# Patient Record
Sex: Male | Born: 1965 | Race: White | Hispanic: No | Marital: Married | Smoking: Never smoker
Health system: Southern US, Community
[De-identification: ages and names within clinical notes are randomized; demographics above are authoritative.]

## PROBLEM LIST (undated history)

## (undated) DIAGNOSIS — E119 Type 2 diabetes mellitus without complications: Secondary | ICD-10-CM

## (undated) DIAGNOSIS — I10 Essential (primary) hypertension: Secondary | ICD-10-CM

## (undated) DIAGNOSIS — Z973 Presence of spectacles and contact lenses: Secondary | ICD-10-CM

## (undated) HISTORY — PX: NO PAST SURGERIES: SHX2092

---

## 2004-06-23 ENCOUNTER — Ambulatory Visit: Payer: Self-pay | Admitting: Family Medicine

## 2005-03-01 ENCOUNTER — Ambulatory Visit: Payer: Self-pay | Admitting: Internal Medicine

## 2005-07-13 ENCOUNTER — Ambulatory Visit: Payer: Self-pay | Admitting: Family Medicine

## 2005-12-10 ENCOUNTER — Ambulatory Visit: Payer: Self-pay | Admitting: Family Medicine

## 2020-10-28 ENCOUNTER — Encounter (HOSPITAL_COMMUNITY): Payer: Self-pay

## 2020-10-28 ENCOUNTER — Other Ambulatory Visit: Payer: Self-pay

## 2020-10-28 ENCOUNTER — Emergency Department (HOSPITAL_COMMUNITY)
Admission: EM | Admit: 2020-10-28 | Discharge: 2020-10-28 | Disposition: A | Discharge status: Home / Self Care | Payer: Self-pay | Attending: Emergency Medicine | Admitting: Emergency Medicine

## 2020-10-28 ENCOUNTER — Emergency Department (HOSPITAL_COMMUNITY): Payer: Self-pay

## 2020-10-28 DIAGNOSIS — I10 Essential (primary) hypertension: Secondary | ICD-10-CM | POA: Insufficient documentation

## 2020-10-28 DIAGNOSIS — E119 Type 2 diabetes mellitus without complications: Secondary | ICD-10-CM | POA: Insufficient documentation

## 2020-10-28 DIAGNOSIS — N200 Calculus of kidney: Secondary | ICD-10-CM | POA: Insufficient documentation

## 2020-10-28 HISTORY — DX: Essential (primary) hypertension: I10

## 2020-10-28 HISTORY — DX: Type 2 diabetes mellitus without complications: E11.9

## 2020-10-28 LAB — COMPREHENSIVE METABOLIC PANEL
ALT: 38 U/L (ref 0–44)
AST: 22 U/L (ref 15–41)
Albumin: 3.7 g/dL (ref 3.5–5.0)
Alkaline Phosphatase: 50 U/L (ref 38–126)
Anion gap: 9 (ref 5–15)
BUN: 22 mg/dL — ABNORMAL HIGH (ref 6–20)
CO2: 23 mmol/L (ref 22–32)
Calcium: 8.9 mg/dL (ref 8.9–10.3)
Chloride: 103 mmol/L (ref 98–111)
Creatinine, Ser: 1.03 mg/dL (ref 0.61–1.24)
GFR, Estimated: >60 mL/min (ref >60)
Glucose, Bld: 156 mg/dL — ABNORMAL HIGH (ref 70–99)
Potassium: 3.4 mmol/L — ABNORMAL LOW (ref 3.5–5.1)
Sodium: 135 mmol/L (ref 135–145)
Total Bilirubin: 0.5 mg/dL (ref 0.3–1.2)
Total Protein: 6.8 g/dL (ref 6.5–8.1)

## 2020-10-28 LAB — CBC WITH DIFFERENTIAL/PLATELET
Abs Immature Granulocytes: 0.03 10*3/uL (ref 0.00–0.07)
Basophils Absolute: 0.1 10*3/uL (ref 0.0–0.1)
Basophils Relative: 1 %
Eosinophils Absolute: 0.4 10*3/uL (ref 0.0–0.5)
Eosinophils Relative: 5 %
HCT: 40 % (ref 39.0–52.0)
Hemoglobin: 13.5 g/dL (ref 13.0–17.0)
Immature Granulocytes: 0 %
Lymphocytes Relative: 24 %
Lymphs Abs: 2.1 10*3/uL (ref 0.7–4.0)
MCH: 29.4 pg (ref 26.0–34.0)
MCHC: 33.8 g/dL (ref 30.0–36.0)
MCV: 87.1 fL (ref 80.0–100.0)
Monocytes Absolute: 0.5 10*3/uL (ref 0.1–1.0)
Monocytes Relative: 6 %
Neutro Abs: 5.7 10*3/uL (ref 1.7–7.7)
Neutrophils Relative %: 64 %
Platelets: 312 10*3/uL (ref 150–400)
RBC: 4.59 MIL/uL (ref 4.22–5.81)
RDW: 12.7 % (ref 11.5–15.5)
WBC: 8.8 10*3/uL (ref 4.0–10.5)
nRBC: 0 % (ref 0.0–0.2)

## 2020-10-28 MED ORDER — FENTANYL CITRATE (PF) 100 MCG/2ML IJ SOLN
50.0000 ug | Freq: Once | INTRAMUSCULAR | Status: AC
  Administered 2020-10-28: 50 ug via INTRAVENOUS
  Filled 2020-10-28: qty 2

## 2020-10-28 MED ORDER — HYDROCODONE-ACETAMINOPHEN 5-325 MG PO TABS: 1.0000 | ORAL_TABLET | Freq: Four times a day (QID) | ORAL | 0 refills | Status: DC | PRN

## 2020-10-28 MED ORDER — ONDANSETRON 4 MG PO TBDP: 4.0000 mg | ORAL_TABLET | Freq: Three times a day (TID) | ORAL | 0 refills | Status: AC | PRN

## 2020-10-28 MED ORDER — SODIUM CHLORIDE 0.9 % IV BOLUS
1000.0000 mL | Freq: Once | INTRAVENOUS | Status: AC
  Administered 2020-10-28: 1000 mL via INTRAVENOUS

## 2020-10-28 MED ORDER — KETOROLAC TROMETHAMINE 30 MG/ML IJ SOLN
15.0000 mg | Freq: Once | INTRAMUSCULAR | Status: AC
  Administered 2020-10-28: 15 mg via INTRAVENOUS
  Filled 2020-10-28: qty 1

## 2020-10-28 NOTE — ED Provider Notes (Signed)
[facility] EMERGENCY DEPARTMENT Provider Note   CSN: [id number] Arrival date & time: 10/28/20  1900     History Chief Complaint  Patient presents with  . Flank Pain    Joseph Mcmahon is a 55 y.o. male.  HPI Patient without a history of kidney stones presents with concern for kidney stone identified at primary care office earlier in the day.  Patient has had pain in the right lower back since yesterday, about 24 hours ago.  Initially patient had Percocet, now ibuprofen for the day, but has had persistent severe pain focally in this area.  There is new hematuria, polyuria, but no fever, no vomiting. Patient presents with paper report and CD of his x-ray from primary care.  Paper report includes labs, notable for urinalysis with hematuria, and x-ray report with 1.2 cm stone, right.     Past Medical History:  Diagnosis Date  . Diabetes mellitus without complication (HCC)   . Hypertension     There are no problems to display for this patient.   History reviewed. No pertinent surgical history.     Family History  Problem Relation Age of Onset  . Diabetes Sister   . Stroke Brother     Social History   Tobacco Use  . Smoking status: Never Smoker  . Smokeless tobacco: Never Used    Home Medications Prior to Admission medications   Not on File    Allergies    Patient has no known allergies.  Review of Systems   Review of Systems  Constitutional:       Per HPI, otherwise negative  HENT:       Per HPI, otherwise negative  Respiratory:       Per HPI, otherwise negative  Cardiovascular:       Per HPI, otherwise negative  Gastrointestinal: Negative for vomiting.  Endocrine:       Negative aside from HPI  Genitourinary:       Neg aside from HPI   Musculoskeletal:       Per HPI, otherwise negative  Skin: Negative.   Neurological: Negative for syncope.    Physical Exam Updated Vital Signs BP 125/88   Pulse 73   Temp 98.5 F (36.9 C) (Oral)   Resp 20    Ht 5\' 8"  (1.727 m)   Wt 99.8 kg   SpO2 94%   BMI 33.45 kg/m   Physical Exam Vitals and nursing note reviewed.  Constitutional:      General: He is not in acute distress.    Appearance: He is well-developed.  HENT:     Head: Normocephalic and atraumatic.  Eyes:     Conjunctiva/sclera: Conjunctivae normal.  Cardiovascular:     Rate and Rhythm: Normal rate and regular rhythm.  Pulmonary:     Effort: Pulmonary effort is normal. No respiratory distress.     Breath sounds: No stridor.  Abdominal:     General: There is no distension.     Comments: Right flank pain  Skin:    General: Skin is warm and dry.  Neurological:     Mental Status: He is alert and oriented to person, place, and time.     ED Results / Procedures / Treatments   Labs (all labs ordered are listed, but only abnormal results are displayed) Labs Reviewed  COMPREHENSIVE METABOLIC PANEL - Abnormal; Notable for the following components:      Result Value   Potassium 3.4 (*)    Glucose, Bld 156 (*)  BUN 22 (*)    All other components within normal limits  CBC WITH DIFFERENTIAL/PLATELET  URINALYSIS, ROUTINE W REFLEX MICROSCOPIC    EKG None  Radiology CT Renal Stone Study  Result Date: 10/28/2020 CLINICAL DATA:  Right flank pain EXAM: CT ABDOMEN AND PELVIS WITHOUT CONTRAST TECHNIQUE: Multidetector CT imaging of the abdomen and pelvis was performed following the standard protocol without IV contrast. COMPARISON:  None. FINDINGS: Lower chest: The visualized lung bases are clear. The visualized heart and pericardium are unremarkable. Tiny hiatal hernia. Hepatobiliary: Wedge like area of low attenuation within the right hepatic lobe is most in keeping with geographic fatty hepatic infiltration. The liver is otherwise unremarkable. No intra or extrahepatic biliary ductal dilation. Gallbladder unremarkable. Pancreas: Unremarkable Spleen: Unremarkable Adrenals/Urinary Tract: The adrenal glands are unremarkable. The  kidneys are normal in size and position. 8.5 cm simple cortical cyst arises exophytically from the upper pole of the right kidney. A nonobstructing right pelvic calculus is identified measuring 8 x 12 x 15 mm. There is no hydronephrosis. No additional renal or ureteral calculi are identified. The bladder is unremarkable. Stomach/Bowel: Stomach is within normal limits. Appendix appears normal. No evidence of bowel wall thickening, distention, or inflammatory changes. No free intraperitoneal gas or fluid. Vascular/Lymphatic: Mild aortoiliac atherosclerotic calcification. No aortic aneurysm. No pathologic adenopathy within the abdomen and pelvis. Reproductive: Prostate is unremarkable. Other: Small fat containing umbilical hernia. The rectum is unremarkable. Musculoskeletal: Degenerative changes are seen at the lumbosacral junction. No acute bone abnormality. IMPRESSION: 15 mm nonobstructing calculus within the right renal pelvis. 8.5 cm simple cyst within the upper pole of the right kidney. Wedge like region of a low-attenuation within the right hepatic lobe most likely representing an area of geographic fatty hepatic infiltration. This is not definitively characterized on this examination, however, and could be confirmed with MRI examination, if desired. Aortic Atherosclerosis (ICD10-I70.0). Electronically Signed   By: Helyn Numbers MD   On: 10/28/2020 22:08    Procedures Procedures   Medications Ordered in ED Medications  sodium chloride 0.9 % bolus 1,000 mL (0 mLs Intravenous Stopped 10/28/20 2259)  ketorolac (TORADOL) 30 MG/ML injection 15 mg (15 mg Intravenous Given 10/28/20 2200)  fentaNYL (SUBLIMAZE) injection 50 mcg (50 mcg Intravenous Given 10/28/20 2158)    ED Course  I have reviewed the triage vital signs and the nursing notes.  Pertinent labs & imaging results that were available during my care of the patient were reviewed by me and considered in my medical decision making (see chart for  details).    11:06 PM On repeat exam patient is awake, alert, speaking clearly, notes that he feels better.  He did have transient dizziness after the analgesia, but this has resolved. He has no evidence for hemodynamic stability. Labs here reassuring, urinalysis from outside hospital reviewed, no evidence for infection. I demonstrated the patient's CT images to him at bedside, specifically illustrating the kidney stone, and cyst.  No evidence for complete obstruction, no evidence for infection, this patient is appropriate for close outpatient urology follow-up. MDM Rules/Calculators/A&P MDM Number of Diagnoses or Management Options Nephrolithiasis: new, needed workup   Amount and/or Complexity of Data Reviewed Clinical lab tests: ordered and reviewed Tests in the radiology section of CPT: ordered and reviewed Tests in the medicine section of CPT: reviewed and ordered Decide to obtain previous medical records or to obtain history from someone other than the patient: yes Review and summarize past medical records: yes Independent visualization of images, tracings, or  specimens: yes  Risk of Complications, Morbidity, and/or Mortality Presenting problems: high Diagnostic procedures: high Management options: high  Critical Care Total time providing critical care: < 30 minutes  Patient Progress Patient progress: improved  Final Clinical Impression(s) / ED Diagnoses Final diagnoses:  Nephrolithiasis    Rx / DC Orders ED Discharge Orders         Ordered    HYDROcodone-acetaminophen (NORCO/VICODIN) 5-325 MG tablet  Every 6 hours PRN        10/28/20 2309    ondansetron (ZOFRAN ODT) 4 MG disintegrating tablet  Every 8 hours PRN        10/28/20 2309           Gerhard Munch, MD 10/28/20 2310

## 2020-10-28 NOTE — Discharge Instructions (Signed)
As discussed, you have been diagnosed with a kidney stone.  Please be sure to follow-up with our urology colleagues.  Please call tomorrow, and inquire whether there are openings available in their [place] or [place] offices.  In the interim, stay well-hydrated, and monitor your condition carefully.  I do not hesitate to return here for concerning changes.

## 2020-10-28 NOTE — ED Triage Notes (Signed)
Pt was sent by PCP due to having a large kidney stone. Pt. Is currently experiencing right flank pain of 7.

## 2020-10-28 NOTE — ED Notes (Signed)
PrePack of Zofran 6 tablet and Norco 6 tablet given and signed for by pt.

## 2020-10-29 MED FILL — Hydrocodone-Acetaminophen Tab 5-325 MG: ORAL | Qty: 6 | Status: AC

## 2020-10-31 DIAGNOSIS — R319 Hematuria, unspecified: Secondary | ICD-10-CM

## 2020-10-31 HISTORY — DX: Hematuria, unspecified: R31.9

## 2020-11-03 ENCOUNTER — Encounter (HOSPITAL_BASED_OUTPATIENT_CLINIC_OR_DEPARTMENT_OTHER): Payer: Self-pay | Admitting: Urology

## 2020-11-03 ENCOUNTER — Other Ambulatory Visit: Payer: Self-pay | Admitting: Urology

## 2020-11-03 ENCOUNTER — Other Ambulatory Visit: Payer: Self-pay

## 2020-11-03 DIAGNOSIS — R519 Headache, unspecified: Secondary | ICD-10-CM

## 2020-11-03 DIAGNOSIS — E119 Type 2 diabetes mellitus without complications: Secondary | ICD-10-CM

## 2020-11-03 DIAGNOSIS — N200 Calculus of kidney: Secondary | ICD-10-CM

## 2020-11-03 DIAGNOSIS — E785 Hyperlipidemia, unspecified: Secondary | ICD-10-CM

## 2020-11-03 DIAGNOSIS — G589 Mononeuropathy, unspecified: Secondary | ICD-10-CM

## 2020-11-03 HISTORY — DX: Type 2 diabetes mellitus without complications: E11.9

## 2020-11-03 HISTORY — DX: Headache, unspecified: R51.9

## 2020-11-03 HISTORY — DX: Hyperlipidemia, unspecified: E78.5

## 2020-11-03 HISTORY — DX: Mononeuropathy, unspecified: G58.9

## 2020-11-03 HISTORY — DX: Calculus of kidney: N20.0

## 2020-11-03 NOTE — Progress Notes (Addendum)
Spoke w/ via phone for pre-op interview---pt Lab needs dos---- ekg  per anesth, surgery orders need 2nd sign            Lab results------cbc with dif, cmp 10-28-2020 epic COVID test -----patient states asymptomatic no test needed Arrive at -------630 am 11-05-2020 NPO after MN NO Solid Food.  Clear liquids from MN until---530 am then npo Med rec completed Medications to take morning of surgery -----zofran prn, atorvastatin Diabetic medication -----none day of surgery Patient instructed no nail polish to be worn day of surgery n/a Patient instructed to bring photo id and insurance card day of surgery Patient aware to have Driver (ride ) / caregiver  girlfriend carly will stay  for 24 hours after surgery  Patient Special Instructions -----none Pre-Op special Istructions -----none Patient verbalized understanding of instructions that were given at this phone interview. Patient denies shortness of breath, chest pain, fever, cough at this phone interview.

## 2020-11-04 NOTE — Anesthesia Preprocedure Evaluation (Addendum)
Anesthesia Evaluation  Patient identified by MRN, date of birth, ID band Patient awake    Reviewed: Allergy & Precautions, H&P , NPO status , Patient's Chart, lab work & pertinent test results  Airway Mallampati: III  TM Distance: >3 FB Neck ROM: Full    Dental no notable dental hx. (+) Teeth Intact, Dental Advisory Given   Pulmonary neg pulmonary ROS,    Pulmonary exam normal breath sounds clear to auscultation       Cardiovascular Exercise Tolerance: Good hypertension, Pt. on medications  Rhythm:Regular Rate:Normal     Neuro/Psych  Headaches, negative psych ROS   GI/Hepatic negative GI ROS, Neg liver ROS,   Endo/Other  diabetes, Type 2, Oral Hypoglycemic Agents  Renal/GU Renal disease  negative genitourinary   Musculoskeletal   Abdominal   Peds  Hematology negative hematology ROS (+)   Anesthesia Other Findings   Reproductive/Obstetrics negative OB ROS                            Anesthesia Physical Anesthesia Plan  ASA: 2  Anesthesia Plan: General   Post-op Pain Management:    Induction: Intravenous  PONV Risk Score and Plan: 3 and Ondansetron, Dexamethasone and Midazolam  Airway Management Planned: LMA  Additional Equipment:   Intra-op Plan:   Post-operative Plan: Extubation in OR  Informed Consent: I have reviewed the patients History and Physical, chart, labs and discussed the procedure including the risks, benefits and alternatives for the proposed anesthesia with the patient or authorized representative who has indicated his/her understanding and acceptance.     Dental advisory given  Plan Discussed with: CRNA  Anesthesia Plan Comments:        Anesthesia Quick Evaluation

## 2020-11-05 ENCOUNTER — Encounter (HOSPITAL_BASED_OUTPATIENT_CLINIC_OR_DEPARTMENT_OTHER): Payer: Self-pay | Admitting: Urology

## 2020-11-05 ENCOUNTER — Encounter (HOSPITAL_BASED_OUTPATIENT_CLINIC_OR_DEPARTMENT_OTHER)
Admission: RE | Disposition: A | Discharge status: Home / Self Care | Payer: Self-pay | Source: Home / Self Care | Attending: Urology

## 2020-11-05 ENCOUNTER — Ambulatory Visit (HOSPITAL_BASED_OUTPATIENT_CLINIC_OR_DEPARTMENT_OTHER)
Admission: RE | Admit: 2020-11-05 | Discharge: 2020-11-05 | Disposition: A | Discharge status: Home / Self Care | Payer: Self-pay | Attending: Urology | Admitting: Urology

## 2020-11-05 ENCOUNTER — Ambulatory Visit (HOSPITAL_BASED_OUTPATIENT_CLINIC_OR_DEPARTMENT_OTHER): Payer: Self-pay | Admitting: Anesthesiology

## 2020-11-05 DIAGNOSIS — Z833 Family history of diabetes mellitus: Secondary | ICD-10-CM | POA: Insufficient documentation

## 2020-11-05 DIAGNOSIS — N35911 Unspecified urethral stricture, male, meatal: Secondary | ICD-10-CM | POA: Insufficient documentation

## 2020-11-05 DIAGNOSIS — Z7984 Long term (current) use of oral hypoglycemic drugs: Secondary | ICD-10-CM | POA: Insufficient documentation

## 2020-11-05 DIAGNOSIS — N200 Calculus of kidney: Secondary | ICD-10-CM | POA: Insufficient documentation

## 2020-11-05 DIAGNOSIS — Z79899 Other long term (current) drug therapy: Secondary | ICD-10-CM | POA: Insufficient documentation

## 2020-11-05 HISTORY — DX: Presence of spectacles and contact lenses: Z97.3

## 2020-11-05 HISTORY — PX: CYSTOSCOPY WITH RETROGRADE PYELOGRAM, URETEROSCOPY AND STENT PLACEMENT: SHX5789

## 2020-11-05 LAB — GLUCOSE, CAPILLARY
Glucose-Capillary: 192 mg/dL — ABNORMAL HIGH (ref 70–99)
Glucose-Capillary: 191 mg/dL — ABNORMAL HIGH (ref 70–99)

## 2020-11-05 SURGERY — CYSTOURETEROSCOPY, WITH RETROGRADE PYELOGRAM AND STENT INSERTION
Anesthesia: General | Site: Ureter | Laterality: Right

## 2020-11-05 MED ORDER — DEXAMETHASONE SODIUM PHOSPHATE 10 MG/ML IJ SOLN
INTRAMUSCULAR | Status: AC
  Filled 2020-11-05: qty 1

## 2020-11-05 MED ORDER — FENTANYL CITRATE (PF) 100 MCG/2ML IJ SOLN: 25.0000 ug | INTRAMUSCULAR | Status: DC | PRN

## 2020-11-05 MED ORDER — ONDANSETRON HCL 4 MG/2ML IJ SOLN
INTRAMUSCULAR | Status: DC | PRN
  Administered 2020-11-05: 4 mg via INTRAVENOUS

## 2020-11-05 MED ORDER — OXYBUTYNIN CHLORIDE 5 MG PO TABS: 5.0000 mg | ORAL_TABLET | Freq: Three times a day (TID) | ORAL | 1 refills | Status: AC | PRN

## 2020-11-05 MED ORDER — MIDAZOLAM HCL 2 MG/2ML IJ SOLN
INTRAMUSCULAR | Status: AC
  Filled 2020-11-05: qty 2

## 2020-11-05 MED ORDER — HYDROCODONE-ACETAMINOPHEN 5-325 MG PO TABS: 1.0000 | ORAL_TABLET | ORAL | 0 refills | Status: AC | PRN

## 2020-11-05 MED ORDER — FENTANYL CITRATE (PF) 100 MCG/2ML IJ SOLN
INTRAMUSCULAR | Status: AC
  Filled 2020-11-05: qty 2

## 2020-11-05 MED ORDER — LIDOCAINE 2% (20 MG/ML) 5 ML SYRINGE
INTRAMUSCULAR | Status: DC | PRN
  Administered 2020-11-05: 60 mg via INTRAVENOUS
  Administered 2020-11-05: 40 mg via INTRAVENOUS

## 2020-11-05 MED ORDER — LACTATED RINGERS IV SOLN: INTRAVENOUS | Status: DC

## 2020-11-05 MED ORDER — ONDANSETRON HCL 4 MG/2ML IJ SOLN
INTRAMUSCULAR | Status: AC
  Filled 2020-11-05: qty 2

## 2020-11-05 MED ORDER — PROPOFOL 10 MG/ML IV BOLUS
INTRAVENOUS | Status: AC
  Filled 2020-11-05: qty 40

## 2020-11-05 MED ORDER — SODIUM CHLORIDE 0.9 % IR SOLN
Status: DC | PRN
  Administered 2020-11-05: 3000 mL via INTRAVESICAL

## 2020-11-05 MED ORDER — PHENYLEPHRINE 40 MCG/ML (10ML) SYRINGE FOR IV PUSH (FOR BLOOD PRESSURE SUPPORT)
PREFILLED_SYRINGE | INTRAVENOUS | Status: AC
  Filled 2020-11-05: qty 10

## 2020-11-05 MED ORDER — PHENYLEPHRINE 40 MCG/ML (10ML) SYRINGE FOR IV PUSH (FOR BLOOD PRESSURE SUPPORT)
PREFILLED_SYRINGE | INTRAVENOUS | Status: DC | PRN
  Administered 2020-11-05: 120 ug via INTRAVENOUS
  Administered 2020-11-05 (×2): 80 ug via INTRAVENOUS
  Administered 2020-11-05: 120 ug via INTRAVENOUS

## 2020-11-05 MED ORDER — MIDAZOLAM HCL 2 MG/2ML IJ SOLN
INTRAMUSCULAR | Status: DC | PRN
  Administered 2020-11-05: 2 mg via INTRAVENOUS

## 2020-11-05 MED ORDER — PROPOFOL 10 MG/ML IV BOLUS
INTRAVENOUS | Status: DC | PRN
  Administered 2020-11-05: 200 mg via INTRAVENOUS

## 2020-11-05 MED ORDER — ACETAMINOPHEN 500 MG PO TABS
1000.0000 mg | ORAL_TABLET | Freq: Once | ORAL | Status: AC
  Administered 2020-11-05: 1000 mg via ORAL

## 2020-11-05 MED ORDER — CEFAZOLIN SODIUM-DEXTROSE 2-4 GM/100ML-% IV SOLN
2.0000 g | INTRAVENOUS | Status: AC
  Administered 2020-11-05: 2 g via INTRAVENOUS

## 2020-11-05 MED ORDER — KETOROLAC TROMETHAMINE 30 MG/ML IJ SOLN
INTRAMUSCULAR | Status: DC | PRN
  Administered 2020-11-05: 30 mg via INTRAVENOUS

## 2020-11-05 MED ORDER — KETOROLAC TROMETHAMINE 30 MG/ML IJ SOLN
INTRAMUSCULAR | Status: AC
  Filled 2020-11-05: qty 1

## 2020-11-05 MED ORDER — CEPHALEXIN 500 MG PO CAPS: 500.0000 mg | ORAL_CAPSULE | Freq: Two times a day (BID) | ORAL | 0 refills | Status: AC

## 2020-11-05 MED ORDER — CEFAZOLIN SODIUM-DEXTROSE 2-4 GM/100ML-% IV SOLN
INTRAVENOUS | Status: AC
  Filled 2020-11-05: qty 100

## 2020-11-05 MED ORDER — DEXAMETHASONE SODIUM PHOSPHATE 10 MG/ML IJ SOLN
INTRAMUSCULAR | Status: DC | PRN
  Administered 2020-11-05: 5 mg via INTRAVENOUS

## 2020-11-05 MED ORDER — FENTANYL CITRATE (PF) 100 MCG/2ML IJ SOLN
INTRAMUSCULAR | Status: DC | PRN
  Administered 2020-11-05 (×3): 50 ug via INTRAVENOUS

## 2020-11-05 MED ORDER — ACETAMINOPHEN 500 MG PO TABS
ORAL_TABLET | ORAL | Status: AC
  Filled 2020-11-05: qty 2

## 2020-11-05 MED ORDER — IOHEXOL 300 MG/ML  SOLN
INTRAMUSCULAR | Status: DC | PRN
  Administered 2020-11-05: 4 mL

## 2020-11-05 MED ORDER — ARTIFICIAL TEARS OPHTHALMIC OINT
TOPICAL_OINTMENT | OPHTHALMIC | Status: AC
  Filled 2020-11-05: qty 3.5

## 2020-11-05 SURGICAL SUPPLY — 28 items
APL SKNCLS STERI-STRIP NONHPOA (GAUZE/BANDAGES/DRESSINGS)
BAG DRAIN URO-CYSTO SKYTR STRL (DRAIN) ×2 IMPLANT
BAG DRN UROCATH (DRAIN) ×1
BASKET STONE 1.7 NGAGE (UROLOGICAL SUPPLIES) IMPLANT
BASKET ZERO TIP NITINOL 2.4FR (BASKET) ×2 IMPLANT
BENZOIN TINCTURE PRP APPL 2/3 (GAUZE/BANDAGES/DRESSINGS) IMPLANT
BSKT STON RTRVL ZERO TP 2.4FR (BASKET) ×1
CATH URET 5FR 28IN OPEN ENDED (CATHETERS) IMPLANT
CLOTH BEACON ORANGE TIMEOUT ST (SAFETY) ×2 IMPLANT
COVER DOME SNAP 22 D (MISCELLANEOUS) ×1 IMPLANT
FIBER LASER FLEXIVA 365 (UROLOGICAL SUPPLIES) IMPLANT
GLOVE SURG ENC MOIS LTX SZ7.5 (GLOVE) ×2 IMPLANT
GOWN STRL REUS W/TWL XL LVL3 (GOWN DISPOSABLE) ×2 IMPLANT
GUIDEWIRE STR DUAL SENSOR (WIRE) ×1 IMPLANT
GUIDEWIRE ZIPWRE .038 STRAIGHT (WIRE) ×2 IMPLANT
IV NS IRRIG 3000ML ARTHROMATIC (IV SOLUTION) ×4 IMPLANT
KIT TURNOVER CYSTO (KITS) ×2 IMPLANT
MANIFOLD NEPTUNE II (INSTRUMENTS) ×2 IMPLANT
NS IRRIG 500ML POUR BTL (IV SOLUTION) ×2 IMPLANT
PACK CYSTO (CUSTOM PROCEDURE TRAY) ×2 IMPLANT
SHEATH URET ACCESS 12FR/35CM (UROLOGICAL SUPPLIES) ×2 IMPLANT
STENT URET 6FRX24 CONTOUR (STENTS) ×2 IMPLANT
STRIP CLOSURE SKIN 1/2X4 (GAUZE/BANDAGES/DRESSINGS) IMPLANT
SYR 10ML LL (SYRINGE) ×2 IMPLANT
TRACTIP FLEXIVA PULS ID 200XHI (Laser) IMPLANT
TRACTIP FLEXIVA PULSE ID 200 (Laser) ×2
TUBE CONNECTING 12X1/4 (SUCTIONS) IMPLANT
TUBING UROLOGY SET (TUBING) ×2 IMPLANT

## 2020-11-05 NOTE — Transfer of Care (Signed)
Immediate Anesthesia Transfer of Care Note  Patient: Joseph Mcmahon  Procedure(s) Performed: Procedure(s) (LRB): CYSTOSCOPY WITH RIGHT RETROGRADE PYELOGRAM, URETEROSCOPY HOLMIUM LASER STONE EXTRACTIONAND STENT PLACEMENT (Right)  Patient Location: PACU  Anesthesia Type: General  Level of Consciousness: awake, alert  and oriented  Airway & Oxygen Therapy: Patient Spontanous Breathing and Patient connected to nasal cannula oxygen  Post-op Assessment: Report given to PACU RN and Post -op Vital signs reviewed and stable  Post vital signs: Reviewed and stable  Complications: No apparent anesthesia complications  Last Vitals:  Vitals Value Taken Time  BP 129/80 11/05/20 0945  Temp 36.4 C 11/05/20 0943  Pulse 81 11/05/20 0947  Resp 10 11/05/20 0947  SpO2 93 % 11/05/20 0947  Vitals shown include unvalidated device data.  Last Pain:  Vitals:   11/05/20 0639  TempSrc: Oral  PainSc: 8       Patients Stated Pain Goal: 4 (11/05/20 2956)  Complications: No notable events documented.

## 2020-11-05 NOTE — Discharge Instructions (Addendum)
Post Anesthesia Home Care Instructions  Activity: Get plenty of rest for the remainder of the day. A responsible individual must stay with you for 24 hours following the procedure.  For the next 24 hours, DO NOT: -Drive a car -Advertising copywriter -Drink alcoholic beverages -Take any medication unless instructed by your physician -Make any legal decisions or sign important papers.  Meals: Start with liquid foods such as gelatin or soup. Progress to regular foods as tolerated. Avoid greasy, spicy, heavy foods. If nausea and/or vomiting occur, drink only clear liquids until the nausea and/or vomiting subsides. Call your physician if vomiting continues.  Special Instructions/Symptoms: Your throat may feel dry or sore from the anesthesia or the breathing tube placed in your throat during surgery. If this causes discomfort, gargle with warm salt water. The discomfort should disappear within 24 hours.  No ibuprofen, Advil, Aleve, Motrin, or naproxen until after 3:30 pm today if needed.   [facility] [phone] Post Ureteroscopy With or Without Stent Instructions  Definitions:  Ureter: The duct that transports urine from the kidney to the bladder. Stent:   A plastic hollow tube that is placed into the ureter, from the kidney to the bladder to prevent the ureter from swelling shut.  GENERAL INSTRUCTIONS:  Despite the fact that no skin incisions were used, the area around the ureter and bladder is raw and irritated. The stent is a foreign body which will further irritate the bladder wall. This irritation is manifested by increased frequency of urination, both day and night, and by an increase in the urge to urinate. In some, the urge to urinate is present almost always. Sometimes the urge is strong enough that you may not be able to stop yourself from urinating. The only real cure is to remove the stent and then give time for the bladder wall to heal which can't be done until the  danger of the ureter swelling shut has passed, which varies.  You may see some blood in your urine while the stent is in place and a few days afterwards. Do not be alarmed, even if the urine was clear for a while. Get off your feet and drink lots of fluids until clearing occurs. If you start to pass clots or don't improve, call us.  DIET: You may return to your normal diet immediately. Because of the raw surface of your bladder, alcohol, spicy foods, acid type foods and drinks with caffeine may cause irritation or frequency and should be used in moderation. To keep your urine flowing freely and to avoid constipation, drink plenty of fluids during the day ( 8-10 glasses ). Tip: Avoid cranberry juice because it is very acidic.  ACTIVITY: Your physical activity doesn't need to be restricted. However, if you are very active, you may see some blood in your urine. We suggest that you reduce your activity under these circumstances until the bleeding has stopped.  BOWELS: It is important to keep your bowels regular during the postoperative period. Straining with bowel movements can cause bleeding. A bowel movement every other day is reasonable. Use a mild laxative if needed, such as Milk of Magnesia 2-3 tablespoons, or 2 Dulcolax tablets. Call if you continue to have problems. If you have been taking narcotics for pain, before, during or after your surgery, you may be constipated. Take a laxative if necessary.   MEDICATION: You should resume your pre-surgery medications unless told not to. In addition you will often be given an antibiotic to prevent infection.  These should be taken as prescribed until the bottles are finished unless you are having an unusual reaction to one of the drugs.  PROBLEMS YOU SHOULD REPORT TO [place]: Fevers over 100.5 Fahrenheit. Heavy bleeding, or clots ( See above notes about blood in urine ). Inability to urinate. Drug reactions ( hives, rash, nausea, vomiting, diarrhea  ). Severe burning or pain with urination that is not improving.  FOLLOW-UP: You will need a follow-up appointment to monitor your progress. Call for this appointment at the number listed above. Usually the first appointment will be about three to fourteen days after your surgery.

## 2020-11-05 NOTE — Op Note (Signed)
Operative Note  Preoperative diagnosis:  1.  1.5 cm right renal stone  Postoperative diagnosis: 1.  1.5 cm right renal stone 2.  Meatal stenosis  Procedure(s): 1.  Cystoscopy with right ureteroscopy, holmium laser lithotripsy and right JJ stent placement 2.  Right retrograde pyelogram with intraoperative interpretation fluoroscopic imaging 3.  Urethral dilation with Leander Rams sounds  Surgeon: Ellison Hughs, MD  Assistants:  None  Anesthesia:  General  Complications:  None  EBL: Less than 5 mL  Specimens: 1.  None  Drains/Catheters: 1.  Right 6 French, 24 cm JJ stent without tether  Intraoperative findings:   Right retrograde pyelogram revealed no filling defects along the entirety of the right ureter.  The right renal pelvis exhibited a filling defect, consistent with the stone seen on recent CT.  There was no dilation of the right renal pelvis or its associated calyces.  Indication:  Joseph Mcmahon is a 55 y.o. male with a 1.5 cm right renal stone causing intermittent episodes of right flank pain.  He is here today for definitive stone treatment.  He has been consented for the above procedures, voices understanding and wishes to proceed.  Description of procedure:  After informed consent was obtained, the patient was brought to the operating room and general LMA anesthesia was administered. The patient was then placed in the dorsolithotomy position and prepped and draped in the usual sterile fashion. A timeout was performed.  An attempt was made to insert a 48 French rigid cystoscope, but the patient was found to have meatal stenosis.  Leander Rams urethral sounds were then used to dilate the urethra starting at 78 French and progressing up to 26 [place], and 2 [place] increments.  Following urethral dilation, the 67 French rigid cystoscope was easily inserted into the urethra and advanced into the bladder.  No urethral or intravesical abnormalities were seen.  A 5 French  ureteral catheter was then inserted into the right ureteral orifice and a retrograde pyelogram was obtained, with the findings listed above.  A Glidewire was then used to intubate the lumen of the ureteral catheter and was advanced up to the right renal pelvis, under fluoroscopic guidance.  The catheter was then removed, leaving the wire in place.  I attempted to place a ureteral access sheath, but met resistance at the level of the mid ureter.  I then advanced the flexible ureteroscope over the zip wire and up to the right renal pelvis with no resistance.  His stone was immediately identified.  A 200 m holmium laser was then used to dust the stone into 2 mm or less fragments.  The flexible ureteroscope was then removed under direct vision, identifying no evidence of ureteral trauma.  A 6 French, 24 cm JJ stent was then placed over the wire and into good position within the right collecting system, confirming placement via fluoroscopy.  The patient's bladder was drained.  He tolerated the procedure well and was transferred to the postanesthesia in stable condition.  Plan: Follow-up in 1 week for office cystoscopy and stent removal

## 2020-11-05 NOTE — H&P (Addendum)
Urology Preoperative H&P   Chief Complaint:  Right flank pain  History of Present Illness: Joseph Mcmahon is a 55 y.o. male with with complaints of right flank pain and known kidney stone. He initially presented to his primary care doctor a few days ago who was concerned for obstructing stone and sent patient to ED. He reports pain over the last several days in the right flank along with associated gross hematuria and nausea. He reports urinary frequency but denies dysuria, suprapubic pain. No fevers, chills. CT scan in ED showed 1.5cm right nonobstructing stone in the right renal pelvis. No leukocytosis, Cr wnl, UA with blood and no concern for infection. He was sent home with pain medication.   Today he reports continued intermittent right flank pain. He denies fevers, chills, nausea, vomiting. Pain is helped by pain meds and antinausea medication.   He has no history of nephrolithiasis, he has no family history of nephrolithiasis. He does not have a history of heart or lung problems and takes no blood thinning medication.     Past Medical History:  Diagnosis Date   dm type 2 11/03/2020   Headache 11/03/2020   TENSION   Hematuria 10/31/2020   Hyperlipemia 11/03/2020   Hypertension    Pinched nerve in neck 11/03/2020   right hand pinkie and ring finger numb @ times per pt, no trouble turning head/nevk per pt   Right renal stone 11/03/2020   Wears glasses     Past Surgical History:  Procedure Laterality Date   NO PAST SURGERIES      Allergies: No Known Allergies  Family History  Problem Relation Age of Onset   Diabetes Sister    Stroke Brother     Social History:  reports that he has never smoked. He has never used smokeless tobacco. He reports current alcohol use. He reports that he does not use drugs.  ROS: A complete review of systems was performed.  All systems are negative except for pertinent findings as noted.  Physical Exam:  Vital signs in last 24 hours: Temp:   [97.7 F (36.5 C)] 97.7 F (36.5 C) (06/15 0639) Pulse Rate:  [84] 84 (06/15 0639) Resp:  [17] 17 (06/15 0639) BP: (140)/(93) 140/93 (06/15 0639) SpO2:  [99 %] 99 % (06/15 0639) Weight:  [100 kg] 100 kg (06/15 0639) Constitutional:  Alert and oriented, No acute distress Cardiovascular: Regular rate and rhythm, No JVD Respiratory: Normal respiratory effort, Lungs clear bilaterally GI: Abdomen is soft, nontender, nondistended, no abdominal masses GU: No CVA tenderness Lymphatic: No lymphadenopathy Neurologic: Grossly intact, no focal deficits Psychiatric: Normal mood and affect  Laboratory Data:  No results for input(s): WBC, HGB, HCT, PLT in the last 72 hours.  No results for input(s): NA, K, CL, GLUCOSE, BUN, CALCIUM, CREATININE in the last 72 hours.  Invalid input(s): CO3   Results for orders placed or performed during the hospital encounter of 11/05/20 (from the past 24 hour(s))  Glucose, capillary     Status: Abnormal   Collection Time: 11/05/20  6:51 AM  Result Value Ref Range   Glucose-Capillary 192 (H) 70 - 99 mg/dL   No results found for this or any previous visit (from the past 240 hour(s)).  Renal Function: No results for input(s): CREATININE in the last 168 hours. Estimated Creatinine Clearance: 93.9 mL/min (by C-G formula based on SCr of 1.03 mg/dL).  Radiologic Imaging: No results found.  I independently reviewed the above imaging studies.  Assessment and Plan  SABRI TEAL is a 55 y.o. male with a 1.5 cm right renal stone  The risks, benefits and alternatives of cystoscopy with RIGHT ureteroscopy, laser lithotripsy and ureteral stent placement was discussed the patient.  Risks included, but are not limited to: bleeding, urinary tract infection, ureteral injury/avulsion, ureteral stricture formation, retained stone fragments, the possibility that multiple surgeries may be required to treat the stone(s), MI, stroke, PE and the inherent risks of general  anesthesia.  The patient voices understanding and wishes to proceed.      Rhoderick Moody, MD 11/05/2020, 8:36 AM  [facility] Pager: [phone]

## 2020-11-05 NOTE — Anesthesia Procedure Notes (Signed)
Procedure Name: LMA Insertion Date/Time: 11/05/2020 8:46 AM Performed by: Norva Pavlov, CRNA Pre-anesthesia Checklist: Patient identified, Emergency Drugs available, Suction available and Patient being monitored Patient Re-evaluated:Patient Re-evaluated prior to induction Oxygen Delivery Method: Circle system utilized Preoxygenation: Pre-oxygenation with 100% oxygen Induction Type: IV induction Ventilation: Mask ventilation without difficulty LMA: LMA inserted LMA Size: 5.0 Number of attempts: 1 Airway Equipment and Method: Bite block Placement Confirmation: positive ETCO2 Tube secured with: Tape Dental Injury: Teeth and Oropharynx as per pre-operative assessment

## 2020-11-05 NOTE — Anesthesia Postprocedure Evaluation (Signed)
Anesthesia Post Note  Patient: JOSUA FERREBEE  Procedure(s) Performed: CYSTOSCOPY WITH RIGHT RETROGRADE PYELOGRAM, URETEROSCOPY HOLMIUM LASER STONE EXTRACTIONAND STENT PLACEMENT (Right: Ureter)     Patient location during evaluation: PACU Anesthesia Type: General Level of consciousness: awake and alert Pain management: pain level controlled Vital Signs Assessment: post-procedure vital signs reviewed and stable Respiratory status: spontaneous breathing, nonlabored ventilation and respiratory function stable Cardiovascular status: blood pressure returned to baseline and stable Postop Assessment: no apparent nausea or vomiting Anesthetic complications: no   No notable events documented.  Last Vitals:  Vitals:   11/05/20 1000 11/05/20 1015  BP: (!) 111/57 (!) 145/93  Pulse: 72 78  Resp: 11 18  Temp:    SpO2: 93% 93%    Last Pain:  Vitals:   11/05/20 1015  TempSrc:   PainSc: 0-No pain                 Harlean Regula,W. EDMOND

## 2020-11-05 NOTE — H&P (Signed)
H&P  Chief Complaint: nephrolithiasis  History of Present Illness: Joseph Mcmahon is a 55 y.o. year old with R 1.5cm renal stone associated with R flank pain due to ball-valving who presents for cystoscopy, right ureteroscopy, possible right ureteral stent placement.   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   Current Outpatient Medications on File Prior to Encounter  Medication Sig Dispense Refill   acetaminophen (TYLENOL) 500 MG tablet Take 1,000 mg by mouth every 6 (six) hours as needed. TAKES 2 TO 3 TABS PRN     atorvastatin (LIPITOR) 20 MG tablet Take 20 mg by mouth daily.     glipiZIDE (GLUCOTROL) 10 MG tablet Take 20 mg by mouth daily before breakfast.     ibuprofen (ADVIL) 600 MG tablet Take 600 mg by mouth as needed for mild pain.     lisinopril (ZESTRIL) 20 MG tablet Take 20 mg by mouth daily. 2 TABS IN AM     metFORMIN (GLUMETZA) 1000 MG (MOD) 24 hr tablet Take 1,000 mg by mouth daily with breakfast.     ondansetron (ZOFRAN ODT) 4 MG disintegrating tablet Take 1 tablet (4 mg total) by mouth every 8 (eight) hours as needed for nausea or vomiting. 20 tablet 0   OVER THE COUNTER MEDICATION Goody powder 2 prn     HYDROcodone-acetaminophen (NORCO/VICODIN) 5-325 MG tablet Take 1 tablet by mouth every 6 (six) hours as needed for severe pain. (Patient not taking: Reported on 11/03/2020) 6 tablet 0     Allergies: No Known Allergies  Family History  Problem Relation Age of Onset   Diabetes Sister    Stroke Brother      Social History:  reports that he has never smoked. He has never used smokeless tobacco. He reports current alcohol use. He reports that he does not use drugs.  ROS: A complete review of systems was performed.  All systems are negative except for pertinent findings as noted.  Physical Exam:  Vital signs in last 24 hours: Temp:  [97.7 F (36.5 C)] 97.7 F (36.5 C) (06/15 0639) Pulse Rate:  [84] 84 (06/15 0639) Resp:  [17] 17 (06/15 0639) BP: (140)/(93) 140/93 (06/15 0639) SpO2:  [99 %] 99 % (06/15 0639) Weight:  [100 kg] 100 kg (06/15 0639) Constitutional:  Alert and oriented, No acute distress Cardiovascular: Regular rate and rhythm, No JVD Respiratory: Normal respiratory effort, Lungs clear bilaterally GI: Abdomen is soft, nontender, nondistended, no abdominal masses GU: No CVA tenderness Lymphatic: No lymphadenopathy Neurologic: Grossly intact, no focal deficits Psychiatric: Normal mood and affect   Laboratory Data:  No results for input(s): WBC, HGB, HCT, PLT in the last 72 hours.  No results for input(s): NA, K, CL, GLUCOSE, BUN, CALCIUM, CREATININE in the last 72 hours.  Invalid input(s): CO3   Results for orders placed or performed during the hospital encounter of 11/05/20 (from the past 24 hour(s))  Glucose, capillary     Status: Abnormal  Collection Time: 11/05/20  6:51 AM  Result Value Ref Range   Glucose-Capillary 192 (H) 70 - 99 mg/dL   No results found for this or any previous visit (from the past 240 hour(s)).  Renal Function: No results for input(s): CREATININE in the last 168 hours. Estimated Creatinine Clearance: 93.9 mL/min (by C-G formula based on SCr of 1.03 mg/dL).  Radiologic Imaging: No results found.  Assessment/Plan:  Will plan to proceed to OR for procedure. Risks and benefits of the procedure have been discussed with patient. Consent obtained.    Lavita Pontius 11/05/2020, 8:34 AM

## 2020-11-06 ENCOUNTER — Encounter (HOSPITAL_BASED_OUTPATIENT_CLINIC_OR_DEPARTMENT_OTHER): Payer: Self-pay | Admitting: Urology

## 2021-07-09 ENCOUNTER — Encounter (INDEPENDENT_AMBULATORY_CARE_PROVIDER_SITE_OTHER): Payer: Self-pay | Admitting: *Deleted

## 2021-12-23 ENCOUNTER — Encounter (INDEPENDENT_AMBULATORY_CARE_PROVIDER_SITE_OTHER): Payer: Self-pay | Admitting: *Deleted

## 2022-03-01 IMAGING — CT CT RENAL STONE PROTOCOL
2 of 4 series · 16 of 46 positions shown, 18 images · non-contrast
Comparison: None.

CLINICAL DATA: Right flank pain

EXAM:
CT ABDOMEN AND PELVIS WITHOUT CONTRAST
TECHNIQUE: Multidetector CT imaging of the abdomen and pelvis was performed
following the standard protocol without IV contrast.

[Series 2: axial st · axial · 0.79mm/px · z∈[-433,-13]mm · 13 of 96 slices shown, 15 images]
[im 6/96  soft-tissue]
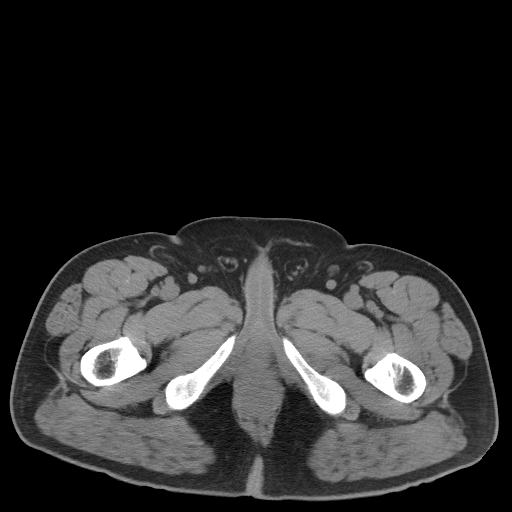
[im 6/96  bone]
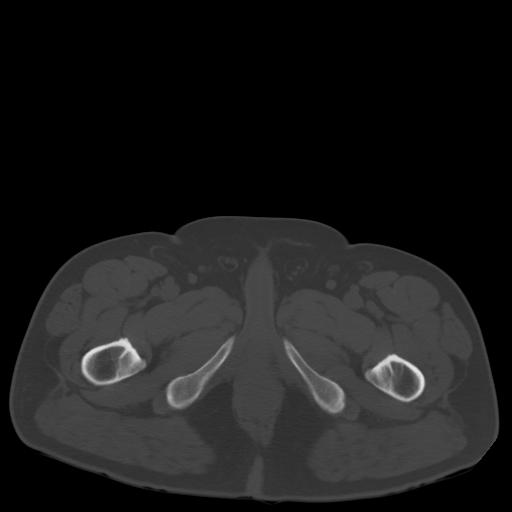
[im 11/96  soft-tissue]
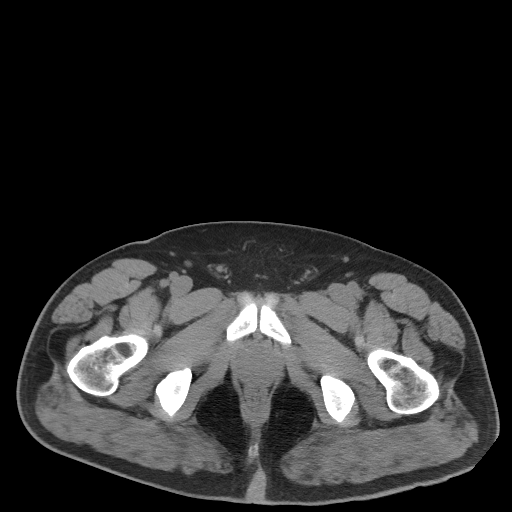
[im 22/96  soft-tissue]
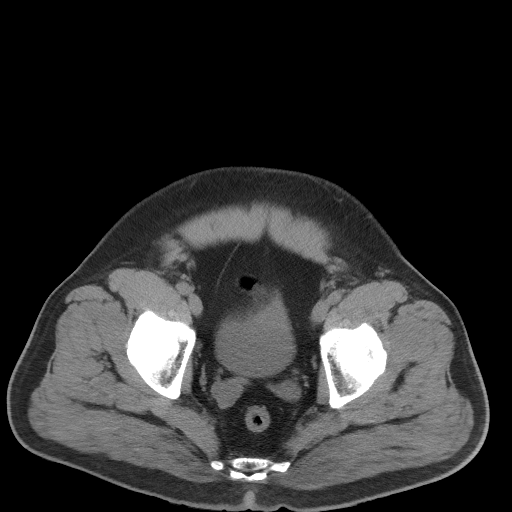
[im 27/96  soft-tissue]
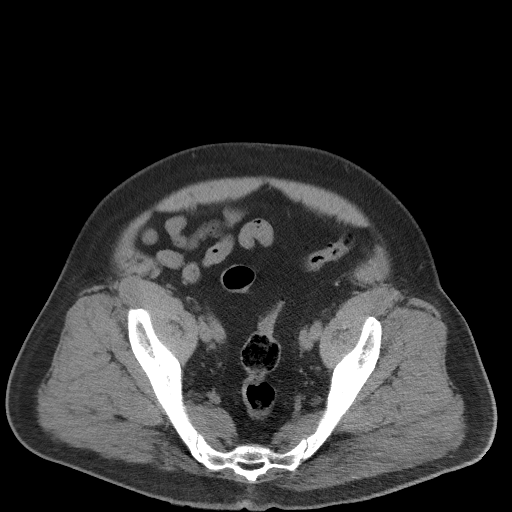
[im 32/96  soft-tissue]
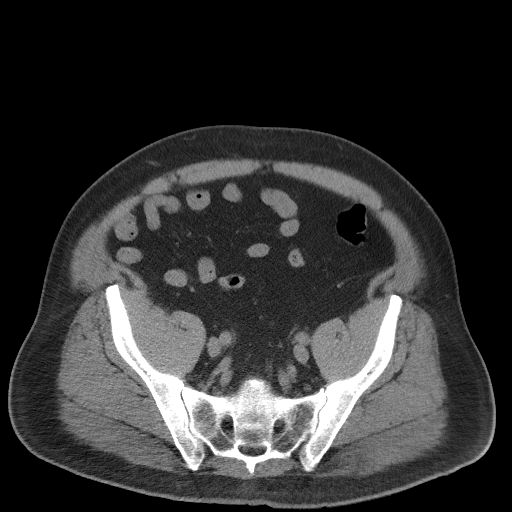
[im 43/96  soft-tissue]
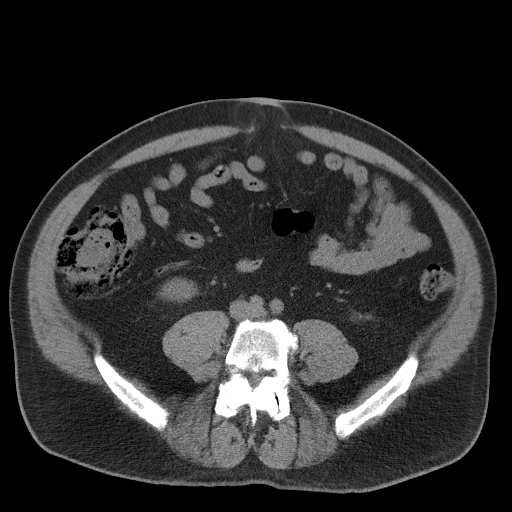
[im 48/96  soft-tissue]
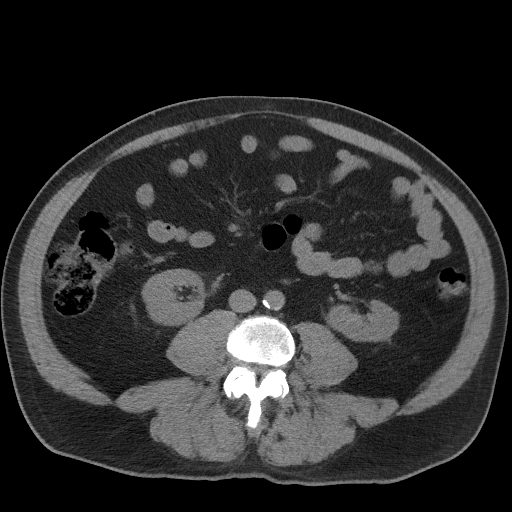
[im 53/96  soft-tissue]
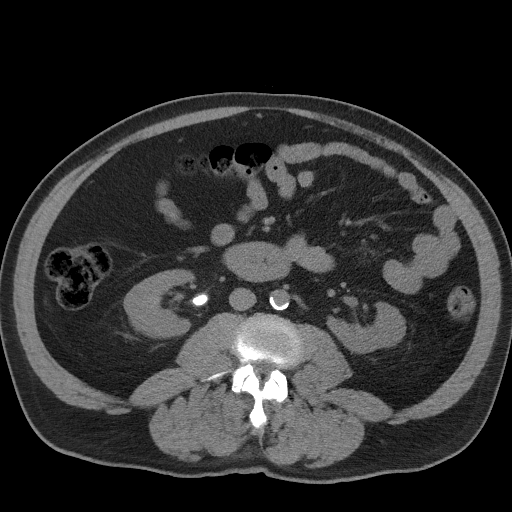
[im 64/96  soft-tissue]
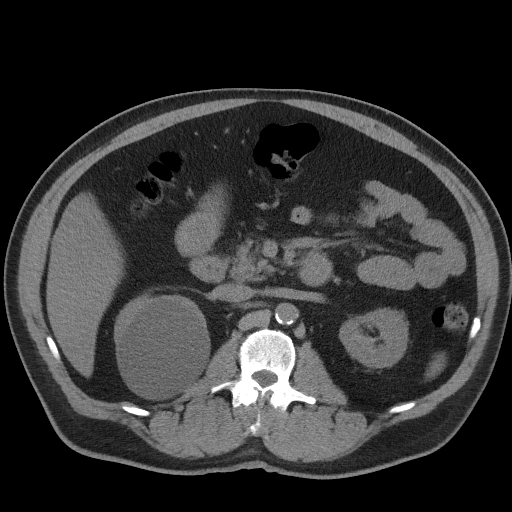
[im 64/96  bone]
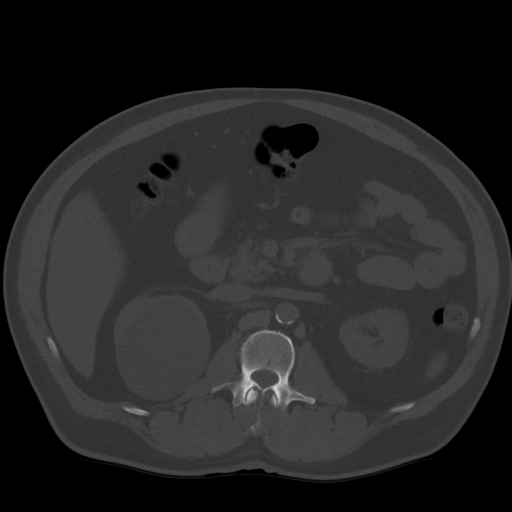
[im 69/96  soft-tissue]
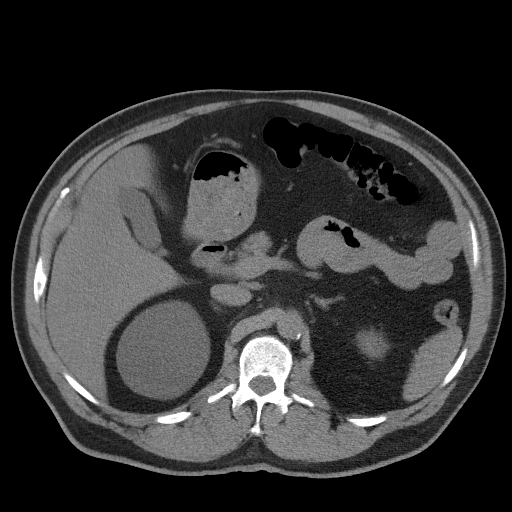
[im 74/96  soft-tissue]
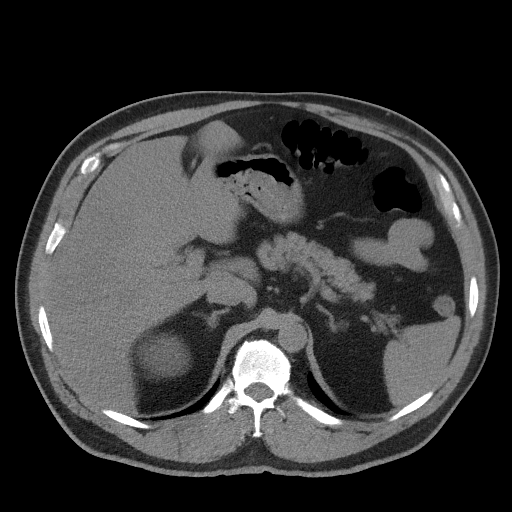
[im 85/96  soft-tissue]
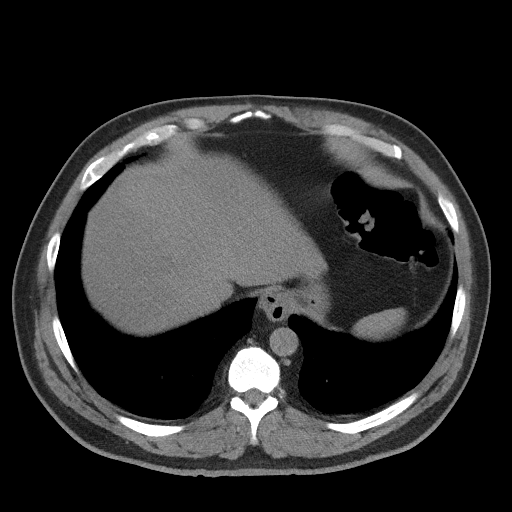
[im 90/96  soft-tissue]
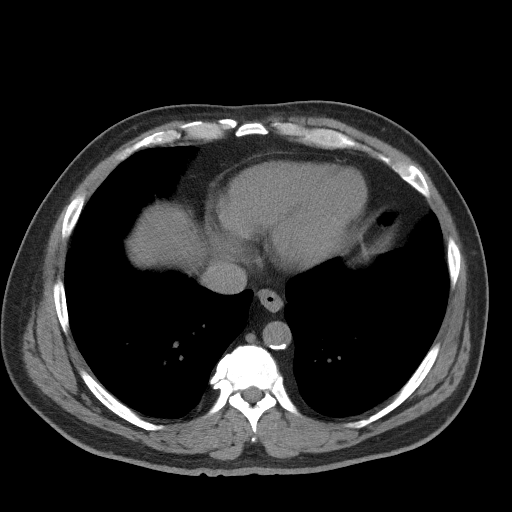

[Series 5: coronal st · coronal · 0.81mm/px · 3 of 103 slices shown]
[im 35/103  soft-tissue]
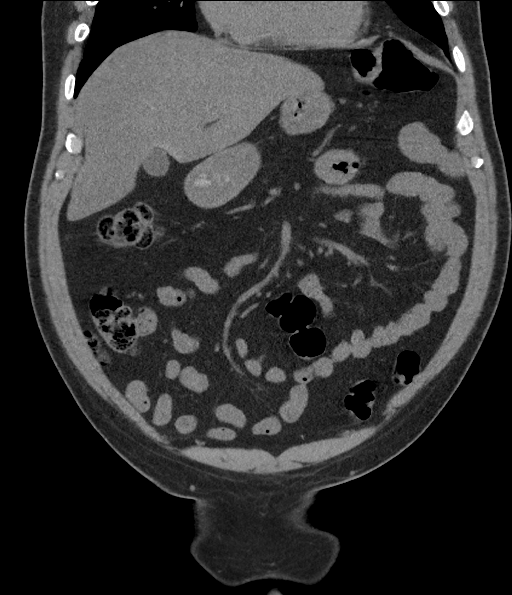
[im 46/103  soft-tissue]
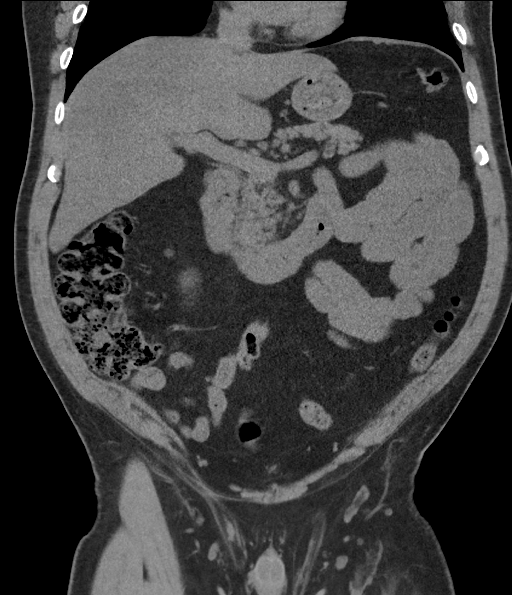
[im 57/103  soft-tissue]
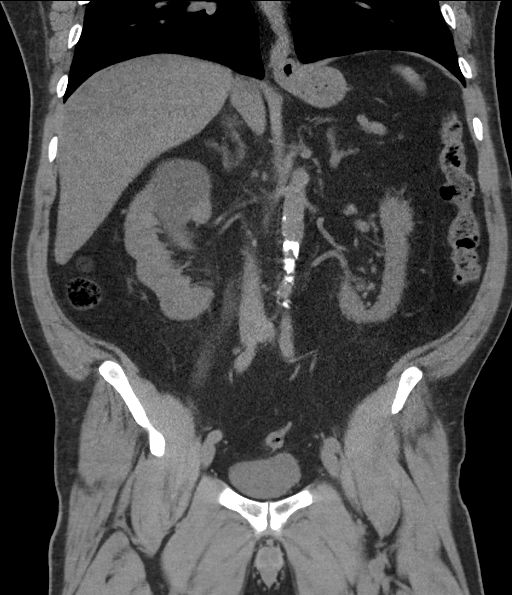

[16 of 46 positions shown; findings below may reference images not displayed]

FINDINGS: Lower chest: The visualized lung bases are clear. The visualized
heart and pericardium are unremarkable. Tiny hiatal hernia.

Hepatobiliary: Wedge like area of low attenuation within the right
hepatic lobe is most in keeping with geographic fatty hepatic
infiltration. The liver is otherwise unremarkable. No intra or
extrahepatic biliary ductal dilation. Gallbladder unremarkable.

Pancreas: Unremarkable

Spleen: Unremarkable

Adrenals/Urinary Tract: The adrenal glands are unremarkable. The
kidneys are normal in size and position. 8.5 cm simple cortical cyst
arises exophytically from the upper pole of the right kidney. A
nonobstructing right pelvic calculus is identified measuring 8 x 12
x 15 mm. There is no hydronephrosis. No additional renal or ureteral
calculi are identified. The bladder is unremarkable.

Stomach/Bowel: Stomach is within normal limits. Appendix appears
normal. No evidence of bowel wall thickening, distention, or
inflammatory changes. No free intraperitoneal gas or fluid.

Vascular/Lymphatic: Mild aortoiliac atherosclerotic calcification.
No aortic aneurysm. No pathologic adenopathy within the abdomen and
pelvis.

Reproductive: Prostate is unremarkable.

Other: Small fat containing umbilical hernia. The rectum is
unremarkable.

Musculoskeletal: Degenerative changes are seen at the lumbosacral
junction. No acute bone abnormality.
IMPRESSION: 15 mm nonobstructing calculus within the right renal pelvis.

8.5 cm simple cyst within the upper pole of the right kidney.

Wedge like region of a low-attenuation within the right hepatic lobe
most likely representing an area of geographic fatty hepatic
infiltration. This is not definitively characterized on this
examination, however, and could be confirmed with MRI examination,
if desired.

Aortic Atherosclerosis (UMBJN-SH2.2).

## 2022-08-03 ENCOUNTER — Encounter: Payer: Self-pay | Admitting: Gastroenterology

## 2022-08-11 DIAGNOSIS — E559 Vitamin D deficiency, unspecified: Secondary | ICD-10-CM | POA: Insufficient documentation

## 2022-08-11 DIAGNOSIS — E119 Type 2 diabetes mellitus without complications: Secondary | ICD-10-CM | POA: Insufficient documentation

## 2022-08-11 DIAGNOSIS — I10 Essential (primary) hypertension: Secondary | ICD-10-CM | POA: Insufficient documentation

## 2022-08-11 DIAGNOSIS — Z789 Other specified health status: Secondary | ICD-10-CM | POA: Insufficient documentation

## 2022-08-11 DIAGNOSIS — E785 Hyperlipidemia, unspecified: Secondary | ICD-10-CM | POA: Insufficient documentation

## 2022-08-11 DIAGNOSIS — F32A Depression, unspecified: Secondary | ICD-10-CM | POA: Insufficient documentation

## 2022-08-11 DIAGNOSIS — E1169 Type 2 diabetes mellitus with other specified complication: Secondary | ICD-10-CM | POA: Insufficient documentation

## 2022-08-25 ENCOUNTER — Ambulatory Visit (AMBULATORY_SURGERY_CENTER): Payer: Self-pay

## 2022-08-25 VITALS — Ht 68.0 in | Wt 215.0 lb

## 2022-08-25 DIAGNOSIS — Z1211 Encounter for screening for malignant neoplasm of colon: Secondary | ICD-10-CM

## 2022-08-25 MED ORDER — NA SULFATE-K SULFATE-MG SULF 17.5-3.13-1.6 GM/177ML PO SOLN: 1.0000 | Freq: Once | ORAL | 0 refills | Status: AC

## 2022-08-25 NOTE — Progress Notes (Signed)
No egg or soy allergy known to patient  No issues known to pt with past sedation with any surgeries or procedures Patient denies ever being told they had issues or difficulty with intubation  No FH of Malignant Hyperthermia Pt is not on diet pills Pt is not on  home 02  Pt is not on blood thinners  Pt denies issues with constipation  No A fib or A flutter Have any cardiac testing pending--no Pt instructed to use [email or web address] or GoodRx for a price reduction on prep   

## 2022-09-09 ENCOUNTER — Encounter: Payer: Self-pay | Admitting: Gastroenterology

## 2022-09-21 ENCOUNTER — Encounter: Payer: Self-pay | Admitting: Gastroenterology

## 2022-10-11 ENCOUNTER — Ambulatory Visit (AMBULATORY_SURGERY_CENTER): Payer: No Typology Code available for payment source

## 2022-10-11 VITALS — Ht 68.0 in | Wt 215.0 lb

## 2022-10-11 DIAGNOSIS — Z1211 Encounter for screening for malignant neoplasm of colon: Secondary | ICD-10-CM

## 2022-10-11 MED ORDER — NA SULFATE-K SULFATE-MG SULF 17.5-3.13-1.6 GM/177ML PO SOLN: 1.0000 | Freq: Once | ORAL | 0 refills | Status: AC

## 2022-10-11 NOTE — Progress Notes (Signed)

## 2022-11-01 ENCOUNTER — Encounter: Payer: Self-pay | Admitting: Gastroenterology

## 2022-11-20 ENCOUNTER — Emergency Department (HOSPITAL_BASED_OUTPATIENT_CLINIC_OR_DEPARTMENT_OTHER)
Admission: EM | Admit: 2022-11-20 | Discharge: 2022-11-20 | Disposition: A | Discharge status: Home / Self Care | Payer: No Typology Code available for payment source | Attending: Emergency Medicine | Admitting: Emergency Medicine

## 2022-11-20 ENCOUNTER — Other Ambulatory Visit: Payer: Self-pay

## 2022-11-20 ENCOUNTER — Encounter (HOSPITAL_BASED_OUTPATIENT_CLINIC_OR_DEPARTMENT_OTHER): Payer: Self-pay

## 2022-11-20 DIAGNOSIS — J020 Streptococcal pharyngitis: Secondary | ICD-10-CM | POA: Insufficient documentation

## 2022-11-20 DIAGNOSIS — J029 Acute pharyngitis, unspecified: Secondary | ICD-10-CM

## 2022-11-20 LAB — GROUP A STREP BY PCR: Group A Strep by PCR: DETECTED — AB

## 2022-11-20 MED ORDER — DEXAMETHASONE 4 MG PO TABS
10.0000 mg | ORAL_TABLET | Freq: Once | ORAL | Status: AC
  Administered 2022-11-20: 10 mg via ORAL
  Filled 2022-11-20: qty 3

## 2022-11-20 MED ORDER — AMOXICILLIN 500 MG PO CAPS: 1000.0000 mg | ORAL_CAPSULE | Freq: Every day | ORAL | 0 refills | Status: DC

## 2022-11-20 MED ORDER — PENICILLIN G BENZATHINE 1200000 UNIT/2ML IM SUSY
2.4000 10*6.[IU] | PREFILLED_SYRINGE | Freq: Once | INTRAMUSCULAR | Status: AC
  Administered 2022-11-20: 2.4 10*6.[IU] via INTRAMUSCULAR
  Filled 2022-11-20: qty 4

## 2022-11-20 NOTE — ED Provider Notes (Addendum)
[place] EMERGENCY DEPARTMENT AT [facility] Provider Note   CSN: [id number] Arrival date & time: 11/20/22  0820     History  Chief Complaint  Patient presents with   Sore Throat    Joseph Mcmahon is a 57 y.o. male.  Patient here with sore throat.  Feels like he is got some swelling in the back of his throat.  For the last few days.  Denies any fever or cough.  Pain with swallowing mostly.  He denies any chest pain or shortness of breath.  Denies any difficulty opening his mouth.  No drooling.  Denies any neck pain.  The history is provided by the patient.       Home Medications Prior to Admission medications   Medication Sig Start Date End Date Taking? Authorizing Provider  amoxicillin (AMOXIL) 500 MG capsule Take 2 capsules (1,000 mg total) by mouth daily for 10 days. 11/20/22 11/30/22 Yes Anagabriela Jokerst, DO  acetaminophen (TYLENOL) 500 MG tablet Take 1,000 mg by mouth every 6 (six) hours as needed. TAKES 2 TO 3 TABS PRN    [provider]  Alprazolam 1 MG/ML CONC See Instructions, TAKE 1 TABLET BY MOUTH AS NEEDED FOR ANXIETY, # 30 tab, 2 Refill(s), Pharmacy: [facility] [phone] 11/20/20   [provider]  atorvastatin (LIPITOR) 20 MG tablet Take 20 mg by mouth daily.    [provider]  CIALIS 10 MG tablet Take by mouth. 07/07/22   [provider]  glipiZIDE (GLUCOTROL) 10 MG tablet Take 20 mg by mouth daily before breakfast.    [provider]  HYDROcodone-acetaminophen (NORCO/VICODIN) 5-325 MG tablet Take 1 tablet by mouth every 4 (four) hours as needed for severe pain. Patient not taking: Reported on 08/25/2022 11/05/20   Rene Paci, MD  ibuprofen (ADVIL) 600 MG tablet Take 600 mg by mouth as needed for mild pain.    [provider]  lisinopril (ZESTRIL) 20 MG tablet Take 20 mg by mouth daily. 2 TABS IN AM    [provider]  metFORMIN (GLUMETZA) 1000 MG (MOD) 24 hr tablet Take 1,000 mg by  mouth daily with breakfast.    [provider]  ondansetron (ZOFRAN ODT) 4 MG disintegrating tablet Take 1 tablet (4 mg total) by mouth every 8 (eight) hours as needed for nausea or vomiting. 10/28/20   Gerhard Munch, MD  OVER THE COUNTER MEDICATION Goody powder 2 prn    [provider]  oxybutynin (DITROPAN) 5 MG tablet Take 1 tablet (5 mg total) by mouth every 8 (eight) hours as needed for bladder spasms. Patient not taking: Reported on 08/25/2022 11/05/20   Rene Paci, MD  pioglitazone (ACTOS) 30 MG tablet Take 30 mg by mouth daily. 07/05/22   [provider]      Allergies    Patient has no known allergies.    Review of Systems   Review of Systems  Physical Exam Updated Vital Signs BP 127/85   Pulse 88   Temp 98.3 F (36.8 C) (Oral)   Resp 14   Ht 5\' 8"  (1.727 m)   Wt 97.5 kg   SpO2 95%   BMI 32.69 kg/m  Physical Exam Vitals and nursing note reviewed.  Constitutional:      General: He is not in acute distress.    Appearance: He is well-developed.  HENT:     Head: Normocephalic and atraumatic.     Mouth/Throat:     Mouth: Mucous membranes are  moist. No oral lesions.     Pharynx: Uvula midline. Posterior oropharyngeal erythema present. No oropharyngeal exudate or uvula swelling.     Tonsils: Tonsillar exudate present. No tonsillar abscesses.  Eyes:     Conjunctiva/sclera: Conjunctivae normal.  Cardiovascular:     Rate and Rhythm: Normal rate and regular rhythm.     Heart sounds: No murmur heard. Pulmonary:     Effort: Pulmonary effort is normal. No respiratory distress.     Breath sounds: Normal breath sounds.  Abdominal:     Palpations: Abdomen is soft.     Tenderness: There is no abdominal tenderness.  Musculoskeletal:        General: No swelling.     Cervical back: Neck supple.  Skin:    General: Skin is warm and dry.     Capillary Refill: Capillary refill takes less than 2 seconds.  Neurological:     Mental Status: He  is alert.  Psychiatric:        Mood and Affect: Mood normal.     ED Results / Procedures / Treatments   Labs (all labs ordered are listed, but only abnormal results are displayed) Labs Reviewed  GROUP A STREP BY PCR - Abnormal; Notable for the following components:      Result Value   Group A Strep by PCR DETECTED (*)    All other components within normal limits    EKG EKG Interpretation Date/Time:  Saturday November 20 2022 08:28:52 EDT Ventricular Rate:  94 PR Interval:  195 QRS Duration:  95 QT Interval:  357 QTC Calculation: 447 R Axis:   72  Text Interpretation: Sinus rhythm Confirmed by Virgina Norfolk [phone]) on 11/20/2022 8:56:16 AM  Radiology No results found.  Procedures Procedures    Medications Ordered in ED Medications  penicillin g benzathine (BICILLIN LA) 1200000 UNIT/2ML injection 2.4 Million Units (has no administration in time range)  dexamethasone (DECADRON) tablet 10 mg (10 mg Oral Given 11/20/22 1308)    ED Course/ Medical Decision Making/ A&P                             Medical Decision Making Risk Prescription drug management.   Joseph Mcmahon is here with sore throat.  Normal vitals.  No fever.  No significant medical history.  Does appear that he has a pharyngitis on exam.  I have no concern for abscess or deep space infectious process.  He has no trismus or drooling.  No submandibular swelling.  Strep test positive.  Given penicillin IM shot and Decadron.  uNderstands return precautions.  Discharged in good condition.  This chart was dictated using voice recognition software.  Despite best efforts to proofread,  errors can occur which can change the documentation meaning.         Final Clinical Impression(s) / ED Diagnoses Final diagnoses:  Sore throat    Rx / DC Orders ED Discharge Orders          Ordered    amoxicillin (AMOXIL) 500 MG capsule  Daily        11/20/22 0901              Virgina Norfolk, DO 11/20/22 0902     Lockie Mola, Cylus [place], DO 11/20/22 [phone]

## 2022-11-20 NOTE — ED Triage Notes (Signed)
Reports difficulty breathing and pain with swallowing and "feels like his tonsils are  coming out".   Feels like his throat is closing.  Denies fever +cough.

## 2023-08-30 ENCOUNTER — Other Ambulatory Visit: Payer: Self-pay

## 2023-08-30 ENCOUNTER — Emergency Department (HOSPITAL_BASED_OUTPATIENT_CLINIC_OR_DEPARTMENT_OTHER)

## 2023-08-30 ENCOUNTER — Emergency Department (HOSPITAL_BASED_OUTPATIENT_CLINIC_OR_DEPARTMENT_OTHER): Admitting: Radiology

## 2023-08-30 ENCOUNTER — Encounter (HOSPITAL_BASED_OUTPATIENT_CLINIC_OR_DEPARTMENT_OTHER): Payer: Self-pay | Admitting: Emergency Medicine

## 2023-08-30 ENCOUNTER — Emergency Department (HOSPITAL_BASED_OUTPATIENT_CLINIC_OR_DEPARTMENT_OTHER)
Admission: EM | Admit: 2023-08-30 | Discharge: 2023-08-31 | Disposition: A | Discharge status: Home / Self Care | Attending: Emergency Medicine | Admitting: Emergency Medicine

## 2023-08-30 DIAGNOSIS — M25572 Pain in left ankle and joints of left foot: Secondary | ICD-10-CM | POA: Insufficient documentation

## 2023-08-30 DIAGNOSIS — Y92410 Unspecified street and highway as the place of occurrence of the external cause: Secondary | ICD-10-CM | POA: Diagnosis not present

## 2023-08-30 DIAGNOSIS — M25532 Pain in left wrist: Secondary | ICD-10-CM | POA: Diagnosis not present

## 2023-08-30 DIAGNOSIS — M25552 Pain in left hip: Secondary | ICD-10-CM | POA: Insufficient documentation

## 2023-08-30 DIAGNOSIS — S0083XA Contusion of other part of head, initial encounter: Secondary | ICD-10-CM

## 2023-08-30 DIAGNOSIS — S00212A Abrasion of left eyelid and periocular area, initial encounter: Secondary | ICD-10-CM | POA: Insufficient documentation

## 2023-08-30 DIAGNOSIS — I10 Essential (primary) hypertension: Secondary | ICD-10-CM | POA: Diagnosis not present

## 2023-08-30 DIAGNOSIS — S161XXA Strain of muscle, fascia and tendon at neck level, initial encounter: Secondary | ICD-10-CM

## 2023-08-30 DIAGNOSIS — E119 Type 2 diabetes mellitus without complications: Secondary | ICD-10-CM | POA: Insufficient documentation

## 2023-08-30 DIAGNOSIS — S0993XA Unspecified injury of face, initial encounter: Secondary | ICD-10-CM | POA: Diagnosis present

## 2023-08-30 MED ORDER — HYDROCODONE-ACETAMINOPHEN 5-325 MG PO TABS
1.0000 | ORAL_TABLET | Freq: Once | ORAL | Status: AC
  Administered 2023-08-30: 1 via ORAL
  Filled 2023-08-30: qty 1
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
## 2023-08-31 MED ORDER — HYDROCODONE-ACETAMINOPHEN 5-325 MG PO TABS: 1.0000 | ORAL_TABLET | Freq: Once | ORAL | Status: DC

## 2023-08-31 MED ORDER — METHOCARBAMOL 500 MG PO TABS: 500.0000 mg | ORAL_TABLET | Freq: Four times a day (QID) | ORAL | 0 refills | Status: AC | PRN

## 2023-08-31 NOTE — Discharge Instructions (Signed)
 Can take over-the-counter medication as needed for pain.  I have also prescribed muscle relaxers, these make you sleepy so do not take them with alcohol or other medications that cause drowsiness.  You can use over-the-counter topical lidocaine patches as needed for discomfort as well.  Fortunately all of your imaging today did not show any fractures or other traumatic findings.  Follow-up closely with your PCP.  Come back to the ER for new or worsening symptoms.
